# Patient Record
Sex: Male | Born: 2000 | Hispanic: No | Marital: Single | Smoking: Never smoker
Health system: Southern US, Community
[De-identification: ages and names within clinical notes are randomized; demographics above are authoritative.]

## PROBLEM LIST (undated history)

## (undated) DIAGNOSIS — I10 Essential (primary) hypertension: Secondary | ICD-10-CM

---
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
## 2023-02-08 ENCOUNTER — Ambulatory Visit (INDEPENDENT_AMBULATORY_CARE_PROVIDER_SITE_OTHER): Payer: Medicaid Other | Admitting: Primary Care
